# Patient Record
Sex: Male | Born: 2000 | Race: White | Hispanic: No | Marital: Single
Health system: Southern US, Community
[De-identification: ages and names within clinical notes are randomized; demographics above are authoritative.]

---
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
## 2019-10-28 IMAGING — CT CT HEAD W/O CM
4 series · 15 of 47 positions shown, 17 images · non-contrast
Comparison: None.

CLINICAL DATA: Head trauma. Struck head on post fall into sidewalk.
No loss of consciousness. Headache.

EXAM:
CT HEAD WITHOUT CONTRAST
TECHNIQUE: Contiguous axial images were obtained from the base of the skull
through the vertex without intravenous contrast.

[Series 3: head without · axial · non-contrast · 0.45mm/px · z∈[-103,+22]mm · 7 of 35 slices shown, 9 images]
[im 5/35  brain]
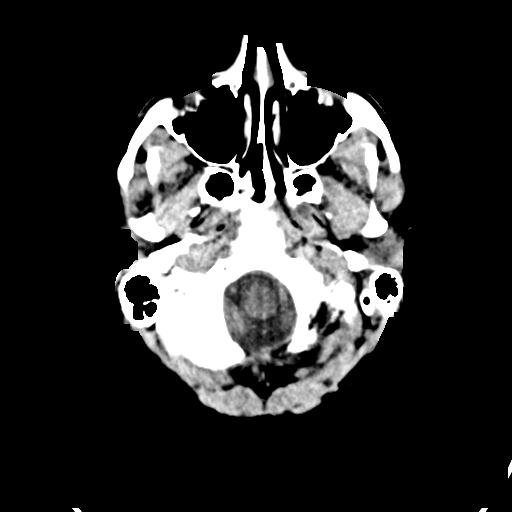
[im 5/35  bone]
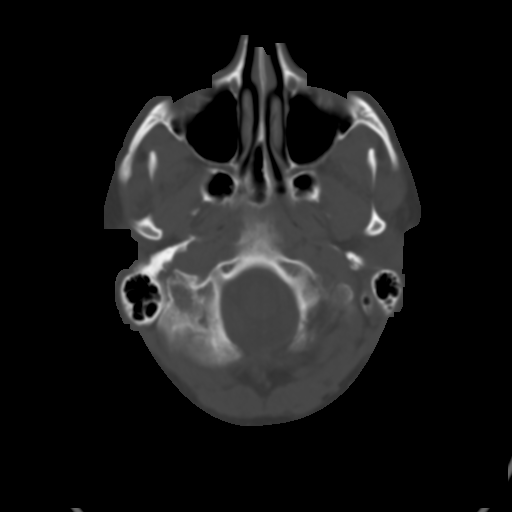
[im 9/35  brain]
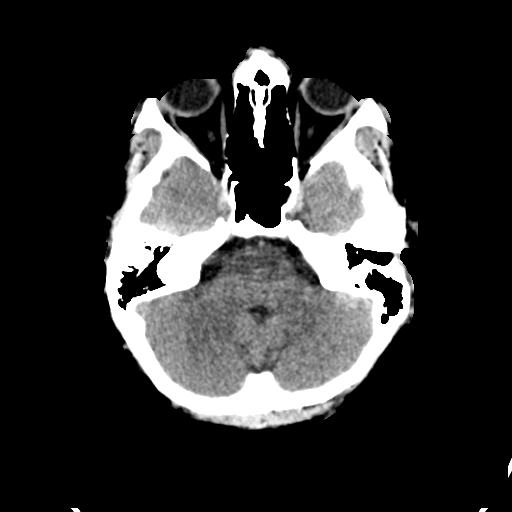
[im 13/35  brain]
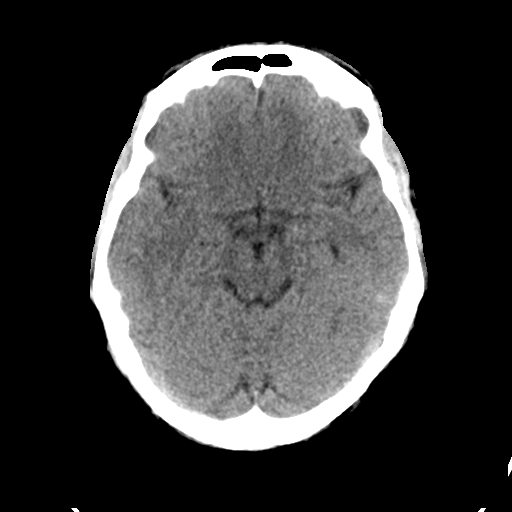
[im 18/35  brain]
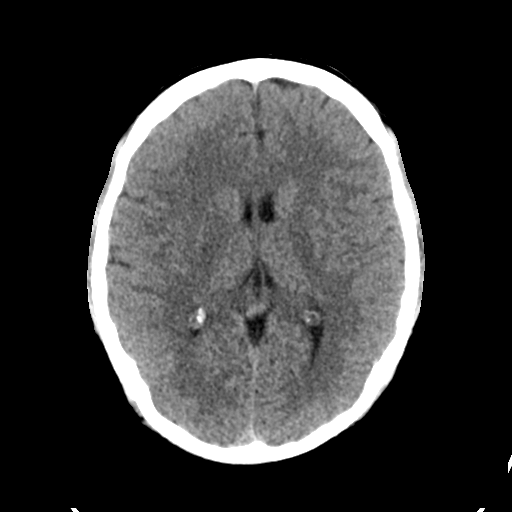
[im 22/35  brain]
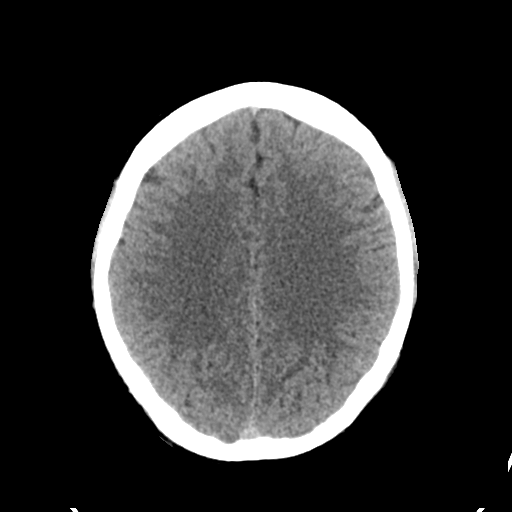
[im 22/35  bone]
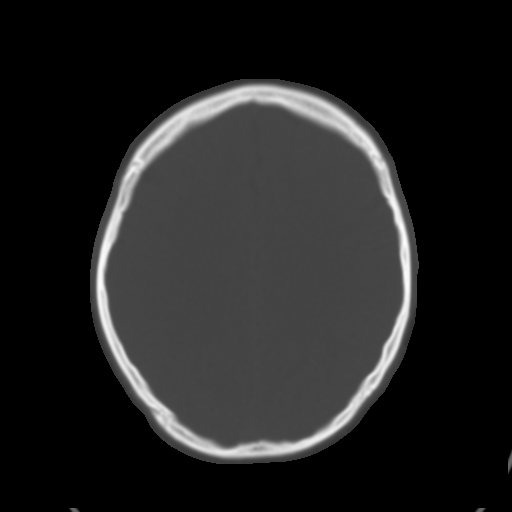
[im 26/35  brain]
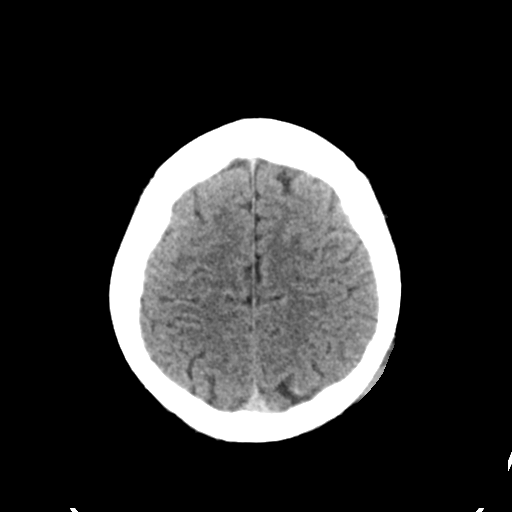
[im 30/35  brain]
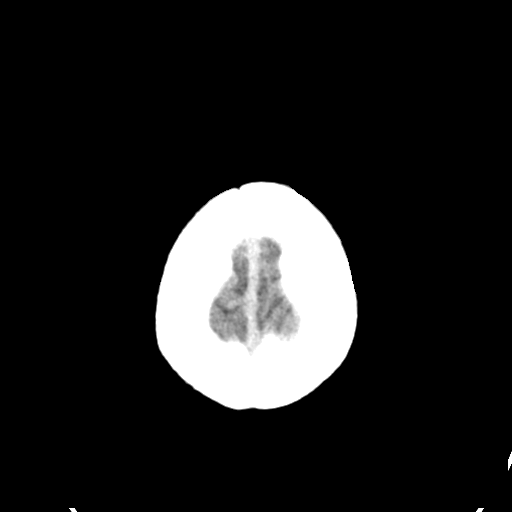

[Series 4: head bone · axial · 0.45mm/px · z∈[-107,-89]mm · 2 of 87 slices shown]
[im 9/87  bone]
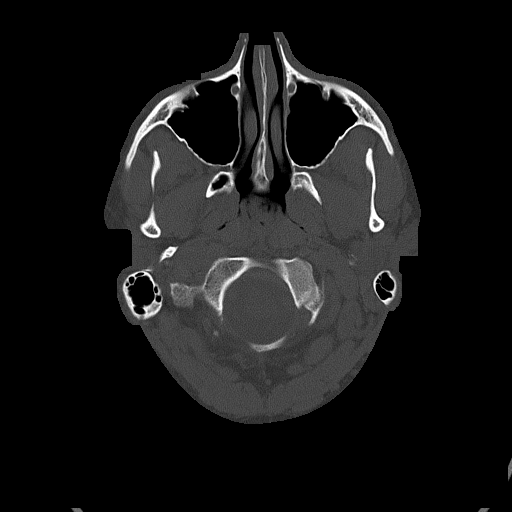
[im 18/87  bone]
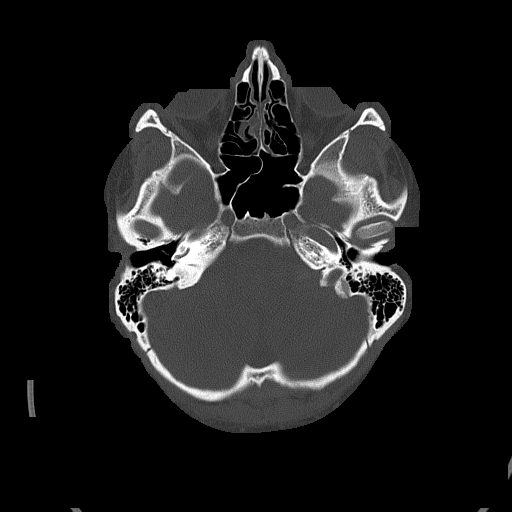

[Series 5: head without cor · coronal · non-contrast · 0.34mm/px · 3 of 67 slices shown]
[im 23/67  brain]
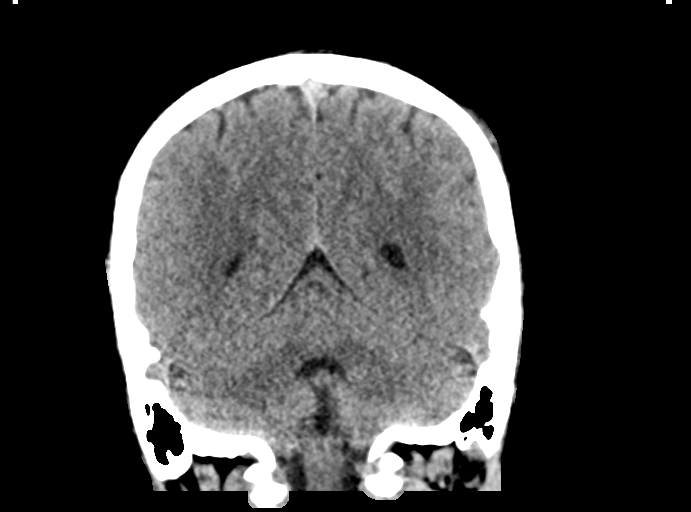
[im 30/67  brain]
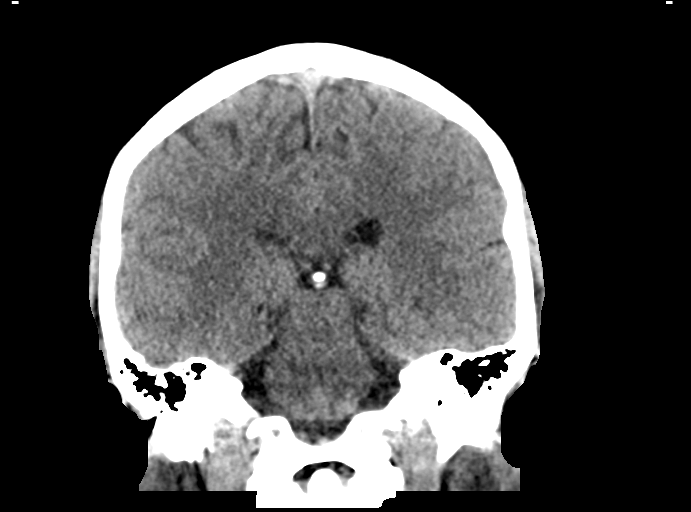
[im 37/67  brain]
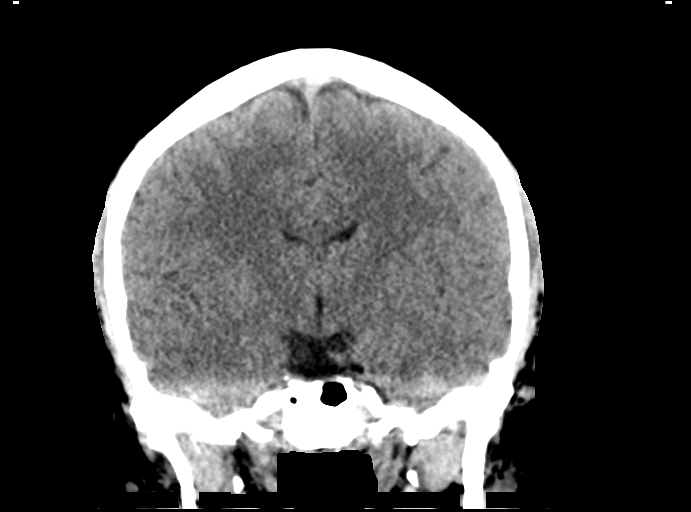

[Series 6: head without sag · sagittal · non-contrast · 0.35mm/px · 3 of 66 slices shown]
[im 22/66  brain]
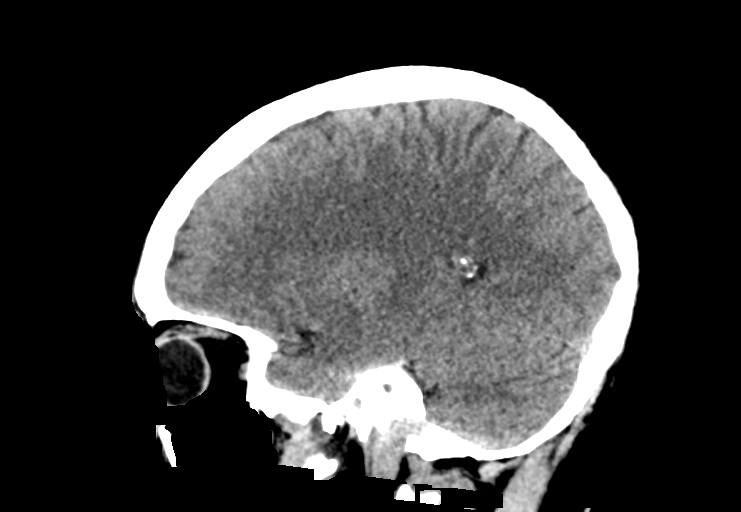
[im 33/66  brain]
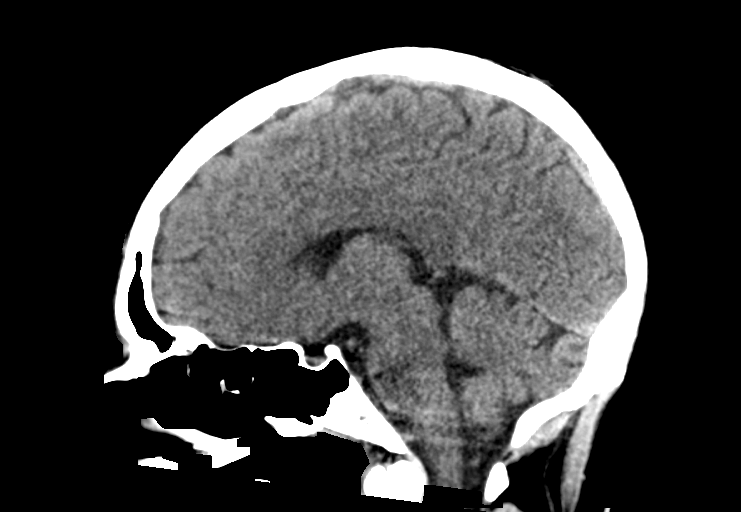
[im 44/66  brain]
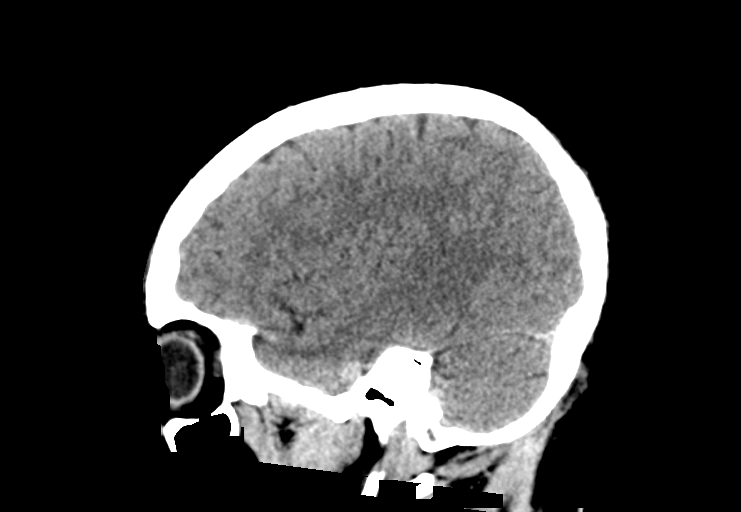

[15 of 47 positions shown; findings below may reference images not displayed]

FINDINGS: Brain: No intracranial hemorrhage, mass effect, or midline shift. No
hydrocephalus. The basilar cisterns are patent. No evidence of
territorial infarct or acute ischemia. No extra-axial or
intracranial fluid collection.

Vascular: No hyperdense vessel or unexpected calcification.

Skull: No fracture or focal lesion.

Sinuses/Orbits: Paranasal sinuses and mastoid air cells are clear.
The visualized orbits are unremarkable.

Other: Left parietal scalp hematoma.
IMPRESSION: Left parietal scalp hematoma. Otherwise negative head CT. No skull
fracture.

## 2020-08-23 DIAGNOSIS — Z20822 Contact with and (suspected) exposure to covid-19: Secondary | ICD-10-CM | POA: Diagnosis not present
